# Patient Record
Sex: Female | Born: 1940 | Race: White | Hispanic: No | Smoking: Never smoker
Health system: Southern US, Community
[De-identification: ages and names within clinical notes are randomized; demographics above are authoritative.]

## PROBLEM LIST (undated history)

## (undated) DIAGNOSIS — I10 Essential (primary) hypertension: Secondary | ICD-10-CM

## (undated) DIAGNOSIS — C801 Malignant (primary) neoplasm, unspecified: Secondary | ICD-10-CM

## (undated) HISTORY — PX: ENDOMETRIAL ABLATION: SHX621

## (undated) HISTORY — PX: APPENDECTOMY: SHX54

## (undated) HISTORY — DX: Essential (primary) hypertension: I10

## (undated) HISTORY — PX: ANAL FISSURE REPAIR: SHX2312

---

## 2004-10-28 HISTORY — PX: COLONOSCOPY: SHX174

## 2007-09-01 ENCOUNTER — Ambulatory Visit: Payer: Self-pay | Admitting: Specialist

## 2012-02-04 ENCOUNTER — Ambulatory Visit: Payer: Self-pay | Admitting: Family Medicine

## 2012-02-12 ENCOUNTER — Ambulatory Visit: Payer: Self-pay | Admitting: Family Medicine

## 2013-07-27 ENCOUNTER — Ambulatory Visit: Payer: Self-pay | Admitting: Family Medicine

## 2014-04-17 ENCOUNTER — Ambulatory Visit: Payer: Self-pay | Admitting: Physician Assistant

## 2014-04-22 ENCOUNTER — Ambulatory Visit: Payer: Self-pay | Admitting: Family Medicine

## 2014-11-21 ENCOUNTER — Emergency Department: Payer: Self-pay | Admitting: Emergency Medicine

## 2014-11-21 LAB — BASIC METABOLIC PANEL
Anion Gap: 8 (ref 7–16)
BUN: 15 mg/dL (ref 7–18)
CO2: 27 mmol/L (ref 21–32)
CREATININE: 0.72 mg/dL (ref 0.60–1.30)
Calcium, Total: 8.5 mg/dL (ref 8.5–10.1)
Chloride: 103 mmol/L (ref 98–107)
EGFR (Non-African Amer.): >60
Glucose: 106 mg/dL — ABNORMAL HIGH (ref 65–99)
Osmolality: 277 (ref 275–301)
Potassium: 3.8 mmol/L (ref 3.5–5.1)
Sodium: 138 mmol/L (ref 136–145)

## 2014-11-21 LAB — CBC
HCT: 41.8 % (ref 35.0–47.0)
HGB: 13.9 g/dL (ref 12.0–16.0)
MCH: 30.5 pg (ref 26.0–34.0)
MCHC: 33.4 g/dL (ref 32.0–36.0)
MCV: 92 fL (ref 80–100)
Platelet: 287 10*3/uL (ref 150–440)
RBC: 4.57 10*6/uL (ref 3.80–5.20)
RDW: 12.2 % (ref 11.5–14.5)
WBC: 7.6 10*3/uL (ref 3.6–11.0)

## 2014-11-21 LAB — TROPONIN I: Troponin-I: <0.02 ng/mL

## 2015-02-03 ENCOUNTER — Ambulatory Visit
Admit: 2015-02-03 | Disposition: A | Discharge status: Home / Self Care | Payer: Self-pay | Attending: Family Medicine | Admitting: Family Medicine

## 2015-06-29 ENCOUNTER — Encounter: Payer: Self-pay | Admitting: Family Medicine

## 2015-06-29 ENCOUNTER — Ambulatory Visit (INDEPENDENT_AMBULATORY_CARE_PROVIDER_SITE_OTHER): Payer: Medicare Other | Admitting: Family Medicine

## 2015-06-29 VITALS — BP 120/70 | HR 72 | Temp 98.3°F | Ht 65.0 in | Wt 140.0 lb

## 2015-06-29 DIAGNOSIS — L03011 Cellulitis of right finger: Secondary | ICD-10-CM | POA: Diagnosis not present

## 2015-06-29 NOTE — Progress Notes (Signed)
Name: Dorothy Harvey   MRN: [id number]    DOB: 07/29/1941   Date:06/29/2015       Progress Note  Subjective  Chief Complaint  Chief Complaint  Patient presents with  . Hand Pain    R) thumb pain after "mulching"    Hand Pain  The incident occurred more than 1 week ago (contusion hammar/6 mos/ crack to nail with crazy glue). The incident occurred at home. The injury mechanism was a direct blow. The pain is present in the right hand (thumb). The quality of the pain is described as aching. The pain is at a severity of 7/10. The pain is moderate. The pain has been intermittent since the incident. Pertinent negatives include no muscle weakness or numbness. The symptoms are aggravated by palpation. She has tried nothing for the symptoms. The treatment provided moderate relief.    No problem-specific assessment & plan notes found for this encounter.   Past Medical History  Diagnosis Date  . Hypertension     Past Surgical History  Procedure Laterality Date  . Appendectomy    . Endometrial ablation    . Anal fissure repair    . Colonoscopy  2006    normal    History reviewed. No pertinent family history.  Social History   Social History  . Marital Status: Married    Spouse Name: N/A  . Number of Children: N/A  . Years of Education: N/A   Occupational History  . Not on file.   Social History Main Topics  . Smoking status: Never Smoker   . Smokeless tobacco: Not on file  . Alcohol Use: No  . Drug Use: No  . Sexual Activity: No   Other Topics Concern  . Not on file   Social History Narrative  . No narrative on file    Allergies  Allergen Reactions  . Diazepam Rash  . Hydroxyzine Other (See Comments)  . Meperidine Other (See Comments)  . Penicillins Hives  . Epinephrine      Review of Systems  Constitutional: Negative for fever and chills.  Musculoskeletal: Negative for myalgias, falls and neck pain.       Pain in nail  Skin: Negative for rash.    Neurological: Negative for focal weakness and numbness.  Endo/Heme/Allergies: Does not bruise/bleed easily.     Objective  Filed Vitals:   06/29/15 1434  BP: 120/70  Pulse: 72  Height: 5\' 5"  (1.651 m)  Weight: 140 lb (63.504 kg)    Physical Exam  Cardiovascular: Normal rate, regular rhythm and normal heart sounds.   Pulmonary/Chest: Effort normal and breath sounds normal. She has no wheezes. She has no rales.  Musculoskeletal:       Right hand: She exhibits tenderness.       Hands: Skin: There is erythema.      Assessment & Plan  Problem List Items Addressed This Visit    None        Dr. Otilio Miu [facility]  06/29/2015

## 2015-08-25 ENCOUNTER — Encounter: Payer: Medicare Other | Admitting: Family Medicine

## 2015-09-07 ENCOUNTER — Other Ambulatory Visit: Payer: Self-pay | Admitting: Family Medicine

## 2015-09-07 DIAGNOSIS — Z780 Asymptomatic menopausal state: Secondary | ICD-10-CM

## 2015-09-07 DIAGNOSIS — Z1231 Encounter for screening mammogram for malignant neoplasm of breast: Secondary | ICD-10-CM

## 2015-09-13 ENCOUNTER — Ambulatory Visit
Admission: RE | Admit: 2015-09-13 | Discharge: 2015-09-13 | Disposition: A | Discharge status: Home / Self Care | Payer: Medicare Other | Source: Ambulatory Visit | Attending: Family Medicine | Admitting: Family Medicine

## 2015-09-13 DIAGNOSIS — Z1231 Encounter for screening mammogram for malignant neoplasm of breast: Secondary | ICD-10-CM | POA: Diagnosis present

## 2015-11-20 ENCOUNTER — Other Ambulatory Visit: Payer: Self-pay

## 2016-05-03 ENCOUNTER — Encounter: Payer: Self-pay | Admitting: *Deleted

## 2016-05-03 ENCOUNTER — Ambulatory Visit
Admission: EM | Admit: 2016-05-03 | Discharge: 2016-05-03 | Disposition: A | Discharge status: Home / Self Care | Payer: Medicare Other | Attending: Family Medicine | Admitting: Family Medicine

## 2016-05-03 DIAGNOSIS — J039 Acute tonsillitis, unspecified: Secondary | ICD-10-CM | POA: Diagnosis not present

## 2016-05-03 MED ORDER — DOXYCYCLINE HYCLATE 100 MG PO TABS: 100.0000 mg | ORAL_TABLET | Freq: Two times a day (BID) | ORAL | Status: DC

## 2016-05-03 MED ORDER — LIDOCAINE VISCOUS 2 % MT SOLN: OROMUCOSAL | Status: DC

## 2016-05-03 NOTE — Discharge Instructions (Signed)
Tonsillitis Tonsillitis is an infection of the throat. This infection causes the tonsils to become red, tender, and puffy (swollen). Tonsils are groups of tissue at the back of your throat. If bacteria caused your infection, antibiotic medicine will be given to you. Sometimes symptoms of tonsillitis can be relieved with the use of steroid medicine. If your tonsillitis is severe and happens often, you may need to get your tonsils removed (tonsillectomy). HOME CARE   Rest and sleep often.  Drink enough fluids to keep your pee (urine) clear or pale yellow.  While your throat is sore, eat soft or liquid foods like:  Soup.  Ice cream.  Instant breakfast drinks.  Eat frozen ice pops.  Gargle with a warm or cold liquid to help soothe the throat. Gargle with a water and salt mix. Mix 1/4 teaspoon of salt and 1/4 teaspoon of baking soda in 1 cup of water.  Only take medicines as told by your doctor.  If you are given medicines (antibiotics), take them as told. Finish them even if you start to feel better. GET HELP IF:  You have large, tender lumps in your neck.  You have a rash.  You cough up green, yellow-brown, or bloody fluid.  You cannot swallow liquids or food for 24 hours.  You notice that only one of your tonsils is swollen. GET HELP RIGHT AWAY IF:   You throw up (vomit).  You have a very bad headache.  You have a stiff neck.  You have chest pain.  You have trouble breathing or swallowing.  You have bad throat pain, drooling, or your voice changes.  You have bad pain not helped by medicine.  You cannot fully open your mouth.  You have redness, puffiness, or bad pain in the neck.  You have a fever. MAKE SURE YOU:   Understand these instructions.  Will watch your condition.  Will get help right away if you are not doing well or get worse.   This information is not intended to replace advice given to you by your health care provider. Make sure you discuss any  questions you have with your health care provider.   Document Released: 04/01/2008 Document Revised: 10/19/2013 Document Reviewed: 04/02/2013 Elsevier Interactive Patient Education [employer].

## 2016-05-03 NOTE — ED Notes (Signed)
Patient started having sore throat symptoms 3 days ago. Sore throat symptoms have progressively become worse.

## 2016-05-03 NOTE — ED Provider Notes (Signed)
CSN: [id number]     Arrival date & time 05/03/16  1836 History   First MD Initiated Contact with Patient 05/03/16 1900     Chief Complaint  Patient presents with  . Sore Throat   (Consider location/radiation/quality/duration/timing/severity/associated sxs/prior Treatment) Patient is a 75 y.o. female presenting with pharyngitis. The history is provided by the patient.  Sore Throat This is a new problem. The current episode started more than 2 days ago. The problem occurs constantly. The problem has been gradually worsening. Pertinent negatives include no chest pain, no abdominal pain and no headaches. Nothing aggravates the symptoms. Nothing relieves the symptoms. The treatment provided no relief.    Past Medical History  Diagnosis Date  . Hypertension    Past Surgical History  Procedure Laterality Date  . Appendectomy    . Endometrial ablation    . Anal fissure repair    . Colonoscopy  2006    normal   Family History  Problem Relation Age of Onset  . Hypertension Mother    Social History  Substance Use Topics  . Smoking status: Never Smoker   . Smokeless tobacco: None  . Alcohol Use: No   OB History    No data available     Review of Systems  Cardiovascular: Negative for chest pain.  Gastrointestinal: Negative for abdominal pain.  Neurological: Negative for headaches.    Allergies  Diazepam; Hydroxyzine; Meperidine; Penicillins; Sulfa antibiotics; and Epinephrine  Home Medications   Prior to Admission medications   Medication Sig Start Date End Date Taking? Authorizing Provider  hydrochlorothiazide (HYDRODIURIL) 25 MG tablet Take 1 tablet by mouth 2 (two) times a week. 05/30/15  Yes Historical Provider, MD  losartan (COZAAR) 50 MG tablet Take 1 tablet by mouth daily at 6 (six) AM. 05/30/15  Yes Historical Provider, MD  Omega-3 Fatty Acids (FISH OIL) 1000 MG CAPS Take 1 capsule by mouth daily at 6 (six) AM.   Yes Historical Provider, MD  vitamin A ([facility] VITAMIN A)  10000 UNIT capsule Take 1 capsule by mouth daily at 6 (six) AM.   Yes Historical Provider, MD  vitamin E 200 UNIT capsule Take 200 Units by mouth daily.   Yes Historical Provider, MD  doxycycline (VIBRA-TABS) 100 MG tablet Take 1 tablet (100 mg total) by mouth 2 (two) times daily. 05/03/16   Norval Gable, MD  lidocaine (XYLOCAINE) 2 % solution 20 ml gargle and spit q 6 hours prn 05/03/16   Norval Gable, MD   Meds Ordered and Administered this Visit  Medications - No data to display  BP 188/87 mmHg  Pulse 74  Temp(Src) 98.3 F (36.8 C) (Oral)  Resp 18  Ht 5\' 5"  (1.651 m)  Wt 134 lb (60.782 kg)  BMI 22.30 kg/m2  SpO2 100% No data found.   Physical Exam  Constitutional: She appears well-developed and well-nourished. No distress.  HENT:  Head: Normocephalic and atraumatic.  Right Ear: Tympanic membrane, external ear and ear canal normal.  Left Ear: Tympanic membrane, external ear and ear canal normal.  Nose: No nose lacerations, sinus tenderness, nasal deformity, septal deviation or nasal septal hematoma. No epistaxis.  No foreign bodies.  Mouth/Throat: Uvula is midline and mucous membranes are normal. Posterior oropharyngeal erythema present. No oropharyngeal exudate, posterior oropharyngeal edema or tonsillar abscesses.  Eyes: Conjunctivae and EOM are normal. Pupils are equal, round, and reactive to light. Right eye exhibits no discharge. Left eye exhibits no discharge. No scleral icterus.  Neck: Normal range of motion.  Neck supple. No thyromegaly present.  Cardiovascular: Normal rate, regular rhythm and normal heart sounds.   Pulmonary/Chest: Effort normal and breath sounds normal. No respiratory distress. She has no wheezes. She has no rales.  Lymphadenopathy:    She has no cervical adenopathy.  Skin: She is not diaphoretic.  Nursing note and vitals reviewed.   ED Course  Procedures (including critical care time)  Labs Review Labs Reviewed - No data to display  Imaging  Review No results found.   Visual Acuity Review  Right Eye Distance:   Left Eye Distance:   Bilateral Distance:    Right Eye Near:   Left Eye Near:    Bilateral Near:         MDM   1. Tonsillitis    Discharge Medication List as of 05/03/2016  7:23 PM    START taking these medications   Details  doxycycline (VIBRA-TABS) 100 MG tablet Take 1 tablet (100 mg total) by mouth 2 (two) times daily., Starting 05/03/2016, Until Discontinued, Normal    lidocaine (XYLOCAINE) 2 % solution 20 ml gargle and spit q 6 hours prn, Normal       1. diagnosis reviewed with patient 2. rx as per orders above; reviewed possible side effects, interactions, risks and benefits  3. Recommend supportive treatment with otc analgesics prn 4. Follow-up prn if symptoms worsen or don't improve    Norval Gable, MD 05/03/16 [phone]

## 2016-05-07 ENCOUNTER — Ambulatory Visit
Admission: RE | Admit: 2016-05-07 | Discharge: 2016-05-07 | Disposition: A | Discharge status: Home / Self Care | Payer: Medicare Other | Source: Ambulatory Visit | Attending: Otolaryngology | Admitting: Otolaryngology

## 2016-05-07 ENCOUNTER — Other Ambulatory Visit: Payer: Self-pay | Admitting: Otolaryngology

## 2016-05-07 DIAGNOSIS — R070 Pain in throat: Secondary | ICD-10-CM | POA: Diagnosis present

## 2016-05-07 DIAGNOSIS — E041 Nontoxic single thyroid nodule: Secondary | ICD-10-CM | POA: Insufficient documentation

## 2016-05-07 LAB — POCT I-STAT CREATININE: Creatinine, Ser: 0.6 mg/dL (ref 0.44–1.00)

## 2016-05-07 MED ORDER — IOPAMIDOL (ISOVUE-300) INJECTION 61%
75.0000 mL | Freq: Once | INTRAVENOUS | Status: AC | PRN
  Administered 2016-05-07: 75 mL via INTRAVENOUS

## 2016-09-04 ENCOUNTER — Other Ambulatory Visit: Payer: Self-pay | Admitting: Family Medicine

## 2016-09-04 DIAGNOSIS — Z1231 Encounter for screening mammogram for malignant neoplasm of breast: Secondary | ICD-10-CM

## 2016-09-25 ENCOUNTER — Ambulatory Visit: Admission: RE | Admit: 2016-09-25 | Payer: Medicare Other | Source: Ambulatory Visit

## 2016-11-15 ENCOUNTER — Other Ambulatory Visit: Payer: Self-pay | Admitting: Medical Oncology

## 2016-11-15 ENCOUNTER — Ambulatory Visit
Admission: RE | Admit: 2016-11-15 | Discharge: 2016-11-15 | Disposition: A | Discharge status: Home / Self Care | Payer: Medicare Other | Source: Ambulatory Visit | Attending: Medical Oncology | Admitting: Medical Oncology

## 2016-11-15 DIAGNOSIS — R05 Cough: Secondary | ICD-10-CM | POA: Diagnosis present

## 2016-11-15 DIAGNOSIS — R058 Other specified cough: Secondary | ICD-10-CM

## 2017-01-07 ENCOUNTER — Ambulatory Visit: Payer: Medicare Other

## 2017-01-08 ENCOUNTER — Other Ambulatory Visit: Payer: Self-pay | Admitting: Family Medicine

## 2017-01-08 ENCOUNTER — Ambulatory Visit
Admission: RE | Admit: 2017-01-08 | Discharge: 2017-01-08 | Disposition: A | Discharge status: Home / Self Care | Payer: Medicare Other | Source: Ambulatory Visit | Attending: Family Medicine | Admitting: Family Medicine

## 2017-01-08 DIAGNOSIS — Z1231 Encounter for screening mammogram for malignant neoplasm of breast: Secondary | ICD-10-CM

## 2017-01-08 HISTORY — DX: Malignant (primary) neoplasm, unspecified: C80.1

## 2017-07-24 ENCOUNTER — Other Ambulatory Visit: Payer: Self-pay | Admitting: Family Medicine

## 2017-07-24 DIAGNOSIS — M5410 Radiculopathy, site unspecified: Secondary | ICD-10-CM

## 2017-07-28 ENCOUNTER — Other Ambulatory Visit
Admission: RE | Admit: 2017-07-28 | Discharge: 2017-07-28 | Disposition: A | Discharge status: Home / Self Care | Payer: Medicare Other | Source: Ambulatory Visit | Attending: Family Medicine | Admitting: Family Medicine

## 2017-07-28 ENCOUNTER — Other Ambulatory Visit: Payer: Self-pay | Admitting: Family Medicine

## 2017-07-28 ENCOUNTER — Ambulatory Visit
Admission: RE | Admit: 2017-07-28 | Discharge: 2017-07-28 | Disposition: A | Discharge status: Home / Self Care | Payer: Medicare Other | Source: Ambulatory Visit | Attending: Family Medicine | Admitting: Family Medicine

## 2017-07-28 DIAGNOSIS — M5136 Other intervertebral disc degeneration, lumbar region: Secondary | ICD-10-CM | POA: Diagnosis not present

## 2017-07-28 DIAGNOSIS — M48061 Spinal stenosis, lumbar region without neurogenic claudication: Secondary | ICD-10-CM | POA: Insufficient documentation

## 2017-07-28 DIAGNOSIS — M5410 Radiculopathy, site unspecified: Secondary | ICD-10-CM

## 2017-07-28 DIAGNOSIS — M4316 Spondylolisthesis, lumbar region: Secondary | ICD-10-CM | POA: Insufficient documentation

## 2017-07-28 LAB — CREATININE, SERUM: Creatinine, Ser: 0.63 mg/dL (ref 0.44–1.00)

## 2017-08-18 ENCOUNTER — Other Ambulatory Visit: Payer: Self-pay | Admitting: Family

## 2017-08-18 DIAGNOSIS — M5416 Radiculopathy, lumbar region: Secondary | ICD-10-CM

## 2017-08-18 DIAGNOSIS — R200 Anesthesia of skin: Secondary | ICD-10-CM

## 2017-08-26 ENCOUNTER — Ambulatory Visit
Admission: RE | Admit: 2017-08-26 | Discharge: 2017-08-26 | Disposition: A | Discharge status: Home / Self Care | Payer: Medicare Other | Source: Ambulatory Visit | Attending: Family | Admitting: Family

## 2017-08-26 DIAGNOSIS — M4726 Other spondylosis with radiculopathy, lumbar region: Secondary | ICD-10-CM | POA: Insufficient documentation

## 2017-08-26 DIAGNOSIS — M4316 Spondylolisthesis, lumbar region: Secondary | ICD-10-CM | POA: Diagnosis not present

## 2017-08-26 DIAGNOSIS — M5416 Radiculopathy, lumbar region: Secondary | ICD-10-CM | POA: Diagnosis present

## 2017-08-26 DIAGNOSIS — R200 Anesthesia of skin: Secondary | ICD-10-CM

## 2017-08-26 DIAGNOSIS — M48061 Spinal stenosis, lumbar region without neurogenic claudication: Secondary | ICD-10-CM | POA: Insufficient documentation

## 2017-12-17 ENCOUNTER — Other Ambulatory Visit: Payer: Self-pay | Admitting: Family Medicine

## 2017-12-17 DIAGNOSIS — Z1231 Encounter for screening mammogram for malignant neoplasm of breast: Secondary | ICD-10-CM

## 2018-01-12 ENCOUNTER — Ambulatory Visit
Admission: RE | Admit: 2018-01-12 | Discharge: 2018-01-12 | Disposition: A | Discharge status: Home / Self Care | Payer: Medicare Other | Source: Ambulatory Visit | Attending: Family Medicine | Admitting: Family Medicine

## 2018-01-12 DIAGNOSIS — Z1231 Encounter for screening mammogram for malignant neoplasm of breast: Secondary | ICD-10-CM | POA: Insufficient documentation

## 2019-06-01 ENCOUNTER — Other Ambulatory Visit: Payer: Self-pay | Admitting: Family Medicine

## 2019-06-01 DIAGNOSIS — Z1231 Encounter for screening mammogram for malignant neoplasm of breast: Secondary | ICD-10-CM

## 2019-07-13 ENCOUNTER — Encounter (INDEPENDENT_AMBULATORY_CARE_PROVIDER_SITE_OTHER): Payer: Self-pay

## 2019-07-13 ENCOUNTER — Other Ambulatory Visit: Payer: Self-pay

## 2019-07-13 ENCOUNTER — Ambulatory Visit
Admission: RE | Admit: 2019-07-13 | Discharge: 2019-07-13 | Disposition: A | Discharge status: Home / Self Care | Payer: Medicare Other | Source: Ambulatory Visit | Attending: Family Medicine | Admitting: Family Medicine

## 2019-07-13 DIAGNOSIS — Z1231 Encounter for screening mammogram for malignant neoplasm of breast: Secondary | ICD-10-CM | POA: Diagnosis present

## 2020-02-21 ENCOUNTER — Ambulatory Visit: Payer: Medicare Other | Attending: Internal Medicine

## 2020-08-23 ENCOUNTER — Other Ambulatory Visit: Payer: Self-pay | Admitting: Family Medicine

## 2020-08-23 DIAGNOSIS — Z1231 Encounter for screening mammogram for malignant neoplasm of breast: Secondary | ICD-10-CM

## 2020-09-20 ENCOUNTER — Ambulatory Visit: Payer: Medicare Other

## 2020-11-02 ENCOUNTER — Inpatient Hospital Stay: Admission: RE | Admit: 2020-11-02 | Payer: Medicare Other | Source: Ambulatory Visit

## 2020-12-04 ENCOUNTER — Other Ambulatory Visit: Payer: Self-pay

## 2020-12-04 ENCOUNTER — Ambulatory Visit
Admission: RE | Admit: 2020-12-04 | Discharge: 2020-12-04 | Disposition: A | Discharge status: Home / Self Care | Payer: Medicare Other | Source: Ambulatory Visit | Attending: Family Medicine | Admitting: Family Medicine

## 2020-12-04 DIAGNOSIS — Z1231 Encounter for screening mammogram for malignant neoplasm of breast: Secondary | ICD-10-CM | POA: Insufficient documentation

## 2021-01-30 ENCOUNTER — Ambulatory Visit
Admission: EM | Admit: 2021-01-30 | Discharge: 2021-01-30 | Disposition: A | Discharge status: Home / Self Care | Payer: Medicare Other | Attending: Emergency Medicine | Admitting: Emergency Medicine

## 2021-01-30 ENCOUNTER — Encounter: Payer: Self-pay | Admitting: Emergency Medicine

## 2021-01-30 ENCOUNTER — Other Ambulatory Visit: Payer: Self-pay

## 2021-01-30 DIAGNOSIS — A938 Other specified arthropod-borne viral fevers: Secondary | ICD-10-CM | POA: Diagnosis not present

## 2021-01-30 DIAGNOSIS — S0006XA Insect bite (nonvenomous) of scalp, initial encounter: Secondary | ICD-10-CM

## 2021-01-30 DIAGNOSIS — W57XXXA Bitten or stung by nonvenomous insect and other nonvenomous arthropods, initial encounter: Secondary | ICD-10-CM

## 2021-01-30 MED ORDER — DOXYCYCLINE HYCLATE 100 MG PO CAPS
100.0000 mg | ORAL_CAPSULE | Freq: Two times a day (BID) | ORAL | 0 refills | Status: AC
Start: 2021-01-30 — End: 2021-02-13

## 2021-01-30 NOTE — ED Triage Notes (Signed)
Pt c/o swollen cervical lymph node. Started about 3 days ago. She states she found a tick in this area this morning. She states the area where the tick was removed is swollen also. She states she has had a mild headache and slightly nauseous.

## 2021-01-30 NOTE — Discharge Instructions (Addendum)
The doxycycline twice daily for 14 days.  Take it with food.  The doxycycline will make you more sensitive to the sun so be sure that you are wearing sunscreen and you are reapply the sunscreen every 2 hours while outdoors or indirect sunlight.  If you develop any rashes, increasing joint pain, protracted fever, headache, or fatigue call the clinic and we will extend your doxycycline for 28 days.

## 2021-01-30 NOTE — ED Provider Notes (Signed)
[facility]    CSN: [id number] Arrival date & time: 01/30/21  1247      History   Chief Complaint Chief Complaint  Patient presents with  . Insect Bite    tick    HPI Dorothy Harvey is a 80 y.o. female.   HPI   80 year old female here for evaluation of swollen lymph nodes and a tick bite.  Patient reports that she noticed a swollen lymph node on the right side of her neck 2 days ago and then more developed down the back of the right side of her neck yesterday.  Patient states she found a tick in her hairline and removed it.  The tick was still alive she reports.  Patient states that the area is red according to her granddaughter.  Patient has had associate symptoms of a mild headache, nausea, chills, and a fever up to 100.  Patient has joint pain at baseline so she is unaware of any changes or increase in the joint pain.  Patient denies any rashes.    Past Medical History:  Diagnosis Date  . Cancer (HCC)    skin ca- squamous  . Hypertension     There are no problems to display for this patient.   Past Surgical History:  Procedure Laterality Date  . ANAL FISSURE REPAIR    . APPENDECTOMY    . COLONOSCOPY  2006   normal  . ENDOMETRIAL ABLATION      OB History   No obstetric history on file.      Home Medications    Prior to Admission medications   Medication Sig Start Date End Date Taking? Authorizing Provider  doxycycline (VIBRAMYCIN) 100 MG capsule Take 1 capsule (100 mg total) by mouth 2 (two) times daily for 14 days. 01/30/21 02/13/21 Yes Margarette Canada, NP  vitamin E 200 UNIT capsule Take 200 Units by mouth daily.    [provider]  hydrochlorothiazide (HYDRODIURIL) 25 MG tablet Take 1 tablet by mouth 2 (two) times a week. 05/30/15 01/30/21  [provider]  losartan (COZAAR) 50 MG tablet Take 1 tablet by mouth daily at 6 (six) AM. 05/30/15 01/30/21  [provider]    Family History Family History  Problem Relation  Age of Onset  . Hypertension Mother   . Breast cancer Neg Hx     Social History Social History   Tobacco Use  . Smoking status: Never Smoker  . Smokeless tobacco: Never Used  Vaping Use  . Vaping Use: Never used  Substance Use Topics  . Alcohol use: No    Alcohol/week: 0.0 standard drinks  . Drug use: No     Allergies   Diazepam, Hydroxyzine, Meperidine, Penicillins, Sulfa antibiotics, and Epinephrine   Review of Systems Review of Systems  Constitutional: Positive for chills and fever. Negative for activity change and appetite change.  Gastrointestinal: Positive for nausea.  Skin: Positive for wound. Negative for rash.  Neurological: Positive for headaches.     Physical Exam Triage Vital Signs ED Triage Vitals  Enc Vitals Group     BP 01/30/21 1314 (!) 179/84     Pulse Rate 01/30/21 1314 82     Resp 01/30/21 1314 18     Temp 01/30/21 1314 98.1 F (36.7 C)     Temp Source 01/30/21 1314 Oral     SpO2 01/30/21 1314 97 %     Weight 01/30/21 1310 134 lb 0.6 oz (60.8 kg)     Height 01/30/21  1310 5\' 5"  (1.651 m)     Head Circumference --      Peak Flow --      Pain Score 01/30/21 1310 5     Pain Loc --      Pain Edu? --      Excl. in [place]? --    No data found.  Updated Vital Signs BP (!) 179/84 (BP Location: Right Arm)   Pulse 82   Temp 98.1 F (36.7 C) (Oral)   Resp 18   Ht 5\' 5"  (1.651 m)   Wt 134 lb 0.6 oz (60.8 kg)   SpO2 97%   BMI 22.31 kg/m   Visual Acuity Right Eye Distance:   Left Eye Distance:   Bilateral Distance:    Right Eye Near:   Left Eye Near:    Bilateral Near:     Physical Exam Vitals and nursing note reviewed.  Constitutional:      General: She is not in acute distress.    Appearance: Normal appearance. She is normal weight. She is not ill-appearing.  Cardiovascular:     Rate and Rhythm: Normal rate and regular rhythm.     Pulses: Normal pulses.     Heart sounds: Normal heart sounds. No murmur heard. No gallop.    Pulmonary:     Effort: Pulmonary effort is normal.     Breath sounds: No wheezing, rhonchi or rales.  Skin:    General: Skin is warm and dry.     Capillary Refill: Capillary refill takes less than 2 seconds.     Findings: Erythema present. No rash.  Neurological:     General: No focal deficit present.     Mental Status: She is alert and oriented to person, place, and time.  Psychiatric:        Mood and Affect: Mood normal.        Behavior: Behavior normal.        Thought Content: Thought content normal.        Judgment: Judgment normal.      UC Treatments / Results  Labs (all labs ordered are listed, but only abnormal results are displayed) Labs Reviewed - No data to display  EKG   Radiology No results found.  Procedures Procedures (including critical care time)  Medications Ordered in UC Medications - No data to display  Initial Impression / Assessment and Plan / UC Course  I have reviewed the triage vital signs and the nursing notes.  Pertinent labs & imaging results that were available during my care of the patient were reviewed by me and considered in my medical decision making (see chart for details).   For evaluation of swollen lymph nodes in conjunction with a tick bite.  Patient discovered a tick today but she has had the swollen lymph nodes for 2 days.  Patient has a large, mildly tender, freely mobile lymph node on the right side of her neck and then she has shotty lymph nodes extending from the occiput down the right side of her neck posteriorly.  Patient has a small area of erythema at the site of the tick bite.  There is no apparent gel part remaining in the wound.  Patient symptoms are concerning consider she has had a headache, nausea, chills, and fever.  Patient states that the tick was not completely flat but it was not fully engorged when she removed it.  It is possible the tick was in place for up to 72 hours before she  found and removed it.  Using this  cluster of symptoms and the presumptive time of the tick being embedded will prophylactically cover patient with doxycycline 100 mg twice daily for 14 days.   Final Clinical Impressions(s) / UC Diagnoses   Final diagnoses:  Tick borne fever  Tick bite of scalp, initial encounter     Discharge Instructions     The doxycycline twice daily for 14 days.  Take it with food.  The doxycycline will make you more sensitive to the sun so be sure that you are wearing sunscreen and you are reapply the sunscreen every 2 hours while outdoors or indirect sunlight.  If you develop any rashes, increasing joint pain, protracted fever, headache, or fatigue call the clinic and we will extend your doxycycline for 28 days.    ED Prescriptions    Medication Sig Dispense Auth. Provider   doxycycline (VIBRAMYCIN) 100 MG capsule Take 1 capsule (100 mg total) by mouth 2 (two) times daily for 14 days. 28 capsule Margarette Canada, NP     PDMP not reviewed this encounter.   Margarette Canada, NP 01/30/21 1353

## 2021-08-09 ENCOUNTER — Other Ambulatory Visit: Payer: Self-pay | Admitting: Family Medicine

## 2021-08-09 ENCOUNTER — Ambulatory Visit
Admission: RE | Admit: 2021-08-09 | Discharge: 2021-08-09 | Disposition: A | Discharge status: Home / Self Care | Payer: Medicare Other | Source: Ambulatory Visit | Attending: Family Medicine | Admitting: Family Medicine

## 2021-08-09 ENCOUNTER — Ambulatory Visit
Admission: RE | Admit: 2021-08-09 | Discharge: 2021-08-09 | Disposition: A | Discharge status: Home / Self Care | Payer: Medicare Other | Attending: Family Medicine | Admitting: Family Medicine

## 2021-08-09 ENCOUNTER — Other Ambulatory Visit: Payer: Self-pay

## 2021-08-09 DIAGNOSIS — M25351 Other instability, right hip: Secondary | ICD-10-CM | POA: Diagnosis present

## 2021-08-09 DIAGNOSIS — M25551 Pain in right hip: Secondary | ICD-10-CM | POA: Diagnosis present

## 2021-09-26 ENCOUNTER — Other Ambulatory Visit: Payer: Self-pay | Admitting: Physician Assistant

## 2021-09-26 DIAGNOSIS — G8929 Other chronic pain: Secondary | ICD-10-CM

## 2021-09-26 DIAGNOSIS — M5442 Lumbago with sciatica, left side: Secondary | ICD-10-CM

## 2021-09-27 ENCOUNTER — Other Ambulatory Visit: Payer: Self-pay | Admitting: Physician Assistant

## 2021-09-27 DIAGNOSIS — G8929 Other chronic pain: Secondary | ICD-10-CM

## 2021-09-27 DIAGNOSIS — M5441 Lumbago with sciatica, right side: Secondary | ICD-10-CM

## 2021-10-05 ENCOUNTER — Ambulatory Visit
Admission: RE | Admit: 2021-10-05 | Discharge: 2021-10-05 | Disposition: A | Discharge status: Home / Self Care | Payer: Medicare Other | Source: Ambulatory Visit | Attending: Physician Assistant | Admitting: Physician Assistant

## 2021-10-05 ENCOUNTER — Other Ambulatory Visit: Payer: Self-pay

## 2021-10-05 DIAGNOSIS — M5442 Lumbago with sciatica, left side: Secondary | ICD-10-CM | POA: Diagnosis present

## 2021-10-05 DIAGNOSIS — M5441 Lumbago with sciatica, right side: Secondary | ICD-10-CM | POA: Insufficient documentation

## 2021-10-05 DIAGNOSIS — G8929 Other chronic pain: Secondary | ICD-10-CM | POA: Diagnosis present

## 2021-11-05 ENCOUNTER — Other Ambulatory Visit: Payer: Self-pay | Admitting: Family Medicine

## 2021-11-06 ENCOUNTER — Other Ambulatory Visit: Payer: Self-pay | Admitting: Family Medicine

## 2021-11-06 DIAGNOSIS — Z1231 Encounter for screening mammogram for malignant neoplasm of breast: Secondary | ICD-10-CM

## 2022-01-28 ENCOUNTER — Ambulatory Visit
Admission: RE | Admit: 2022-01-28 | Discharge: 2022-01-28 | Disposition: A | Discharge status: Home / Self Care | Payer: Medicare Other | Source: Ambulatory Visit | Attending: Family Medicine | Admitting: Family Medicine

## 2022-01-28 DIAGNOSIS — Z1231 Encounter for screening mammogram for malignant neoplasm of breast: Secondary | ICD-10-CM | POA: Diagnosis not present

## 2022-02-05 IMAGING — MR MR LUMBAR SPINE W/O CM
4 of 5 series · 25 of 48 positions shown · non-contrast
Comparison: MR lumbar 08/26/2017; CT lumbar 07/28/2017; X-ray
lumbar 07/27/2013.

CLINICAL DATA: Chronic lower back pain with bilateral leg weakness
for 4 years, and notes trouble walking. Patient reports nerve
damage.

EXAM:
MRI LUMBAR SPINE WITHOUT CONTRAST
TECHNIQUE: Multiplanar, multisequence MR imaging of the lumbar spine was
performed. No intravenous contrast was administered.

[Series 2: T2 · sagittal · 4.0mm · 0.81mm/px · 6 of 17 slices shown (1 of 2)]
[im 1/17]
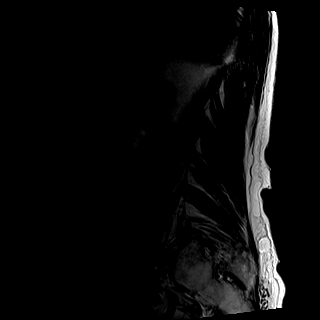
[im 4/17]
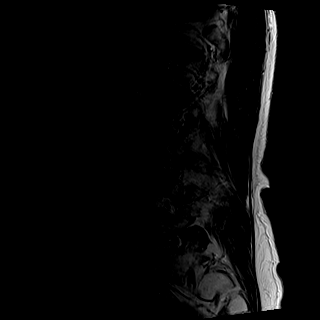
[im 7/17]
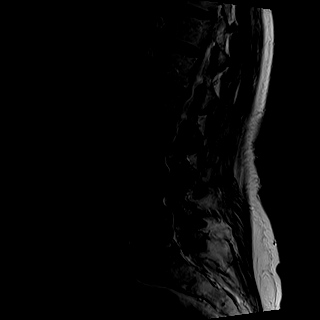
[im 10/17]
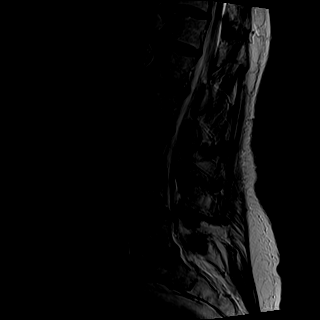
[im 13/17]
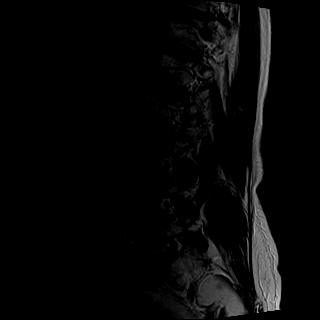
[im 17/17]
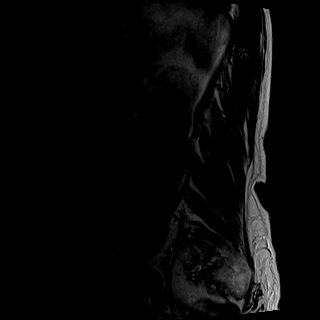

[Series 3: T1 · sagittal · 4.0mm · 0.41mm/px · 6 of 17 slices shown (1 of 2)]
[im 1/17]
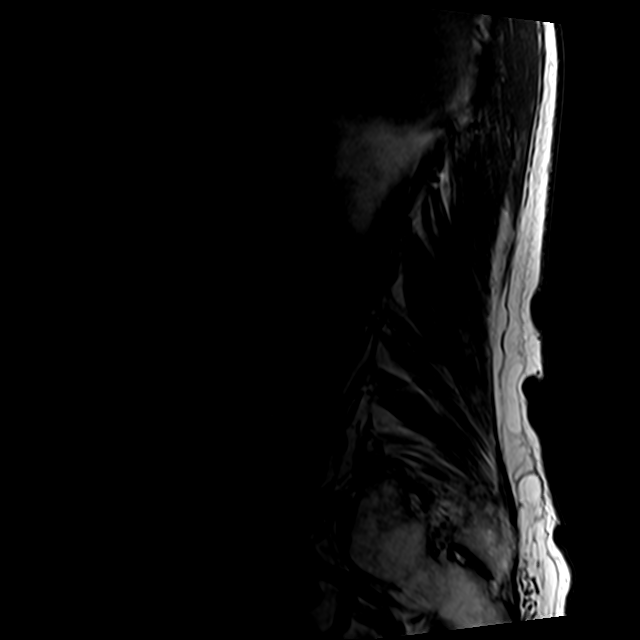
[im 4/17]
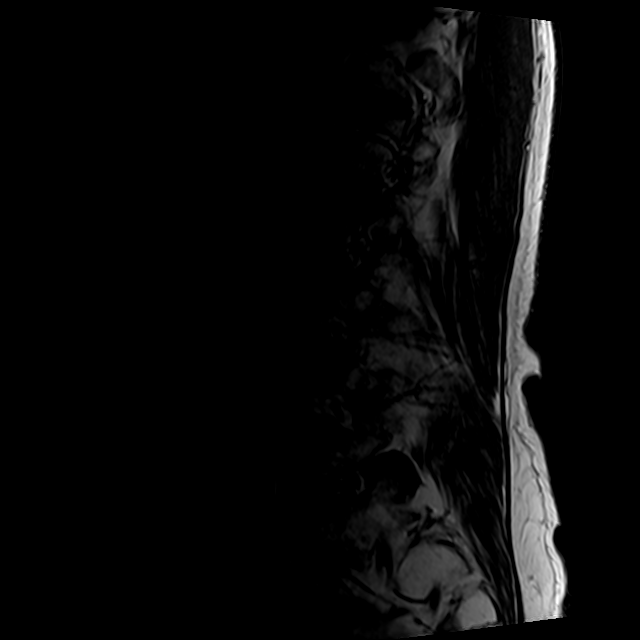
[im 7/17]
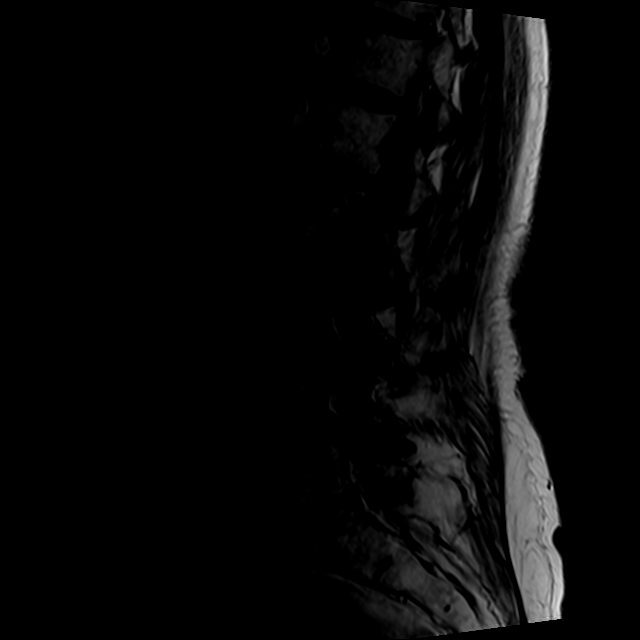
[im 10/17]
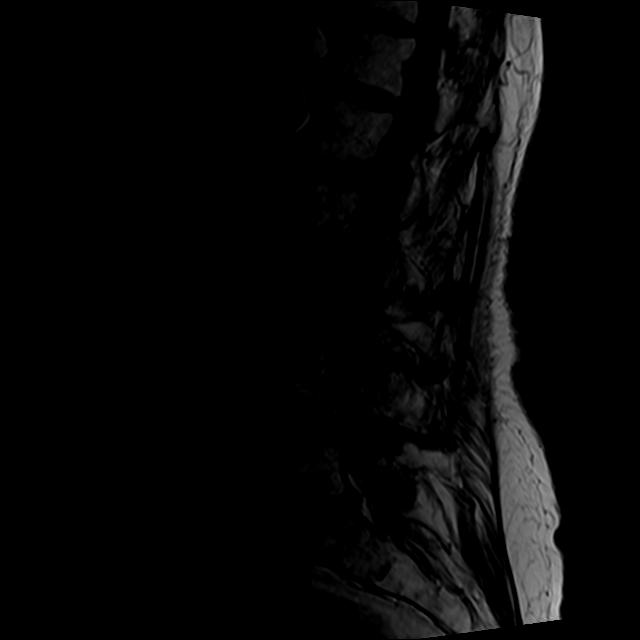
[im 13/17]
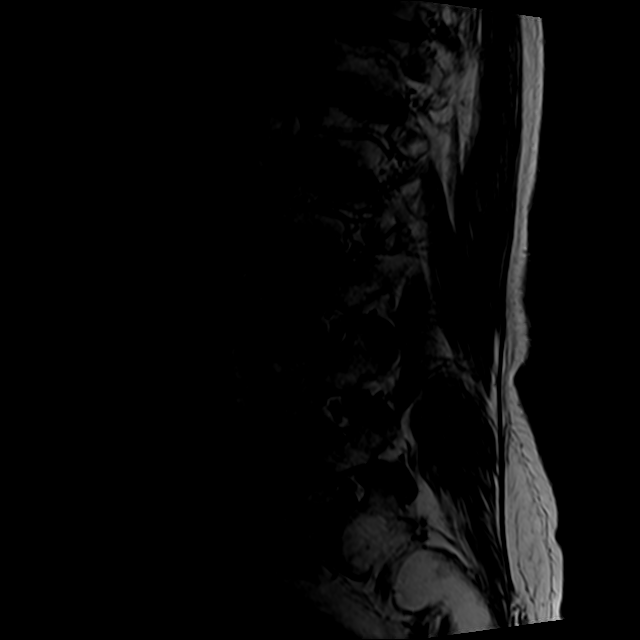
[im 17/17]
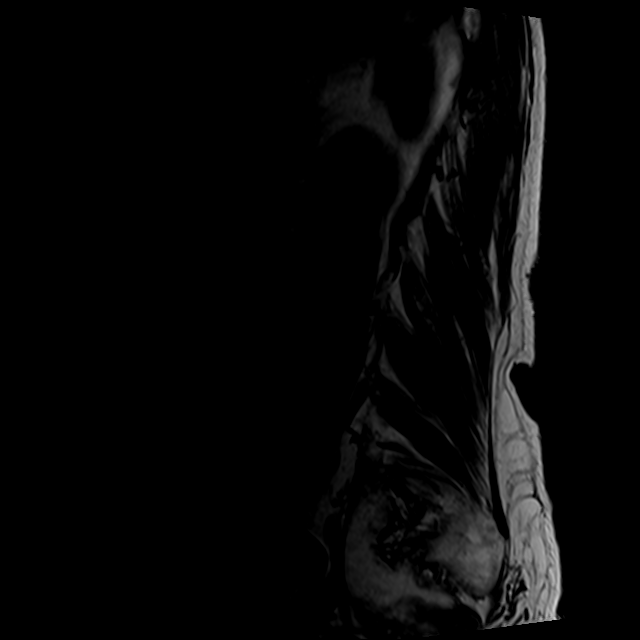

[Series 5: T2 · axial · 4.0mm · 0.78mm/px · z∈[-69,+157]mm · 9 of 41 slices shown (2 of 2)]
[im 1/41]
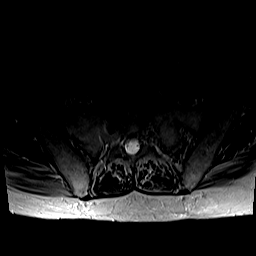
[im 6/41]
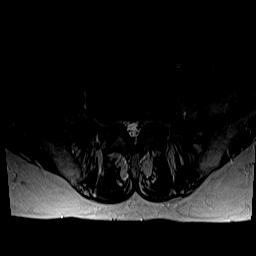
[im 12/41]
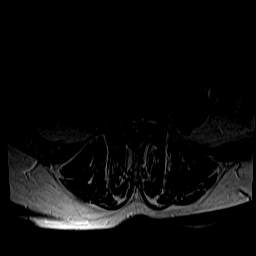
[im 18/41]
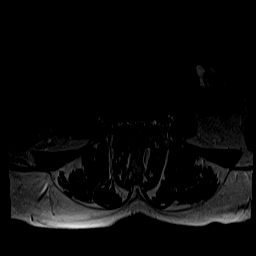
[im 21/41]
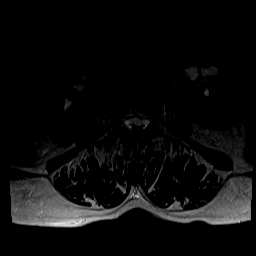
[im 23/41]
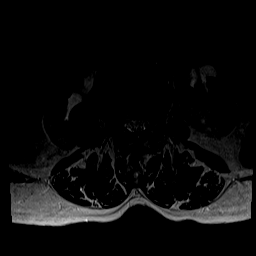
[im 29/41]
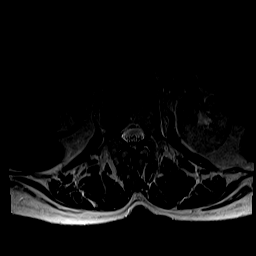
[im 35/41]
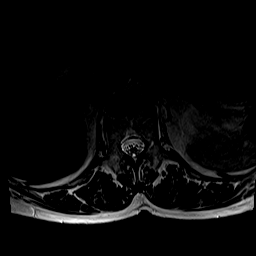
[im 41/41]
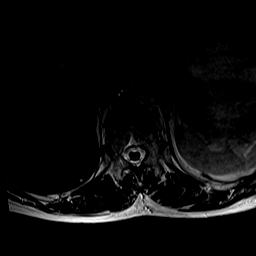

[Series 6: T1 · axial · 4.0mm · 0.39mm/px · z∈[-69,+127]mm · 4 of 41 slices shown (2 of 2)]
[im 1/41]
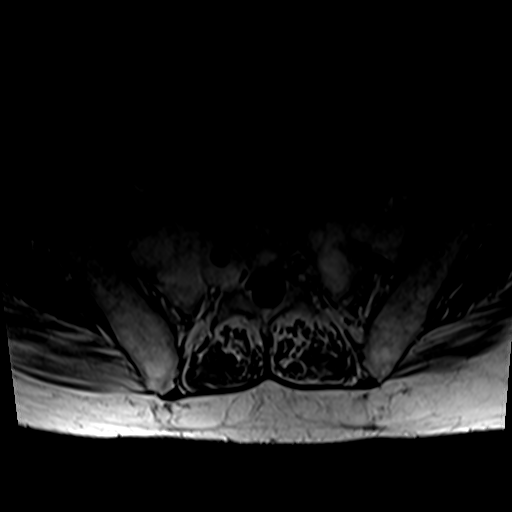
[im 6/41]
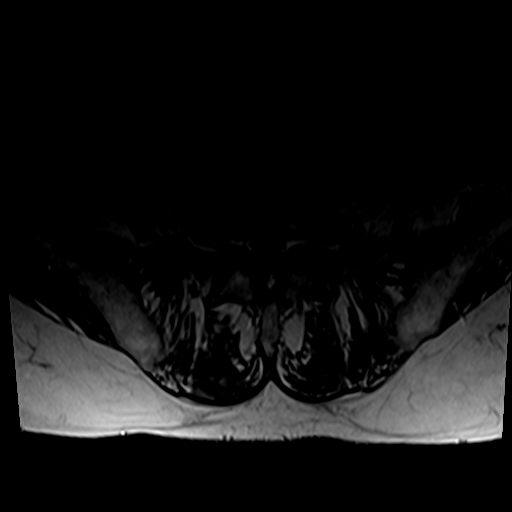
[im 21/41]
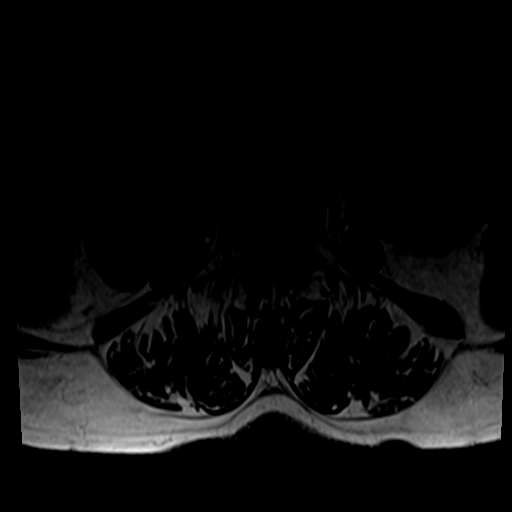
[im 35/41]
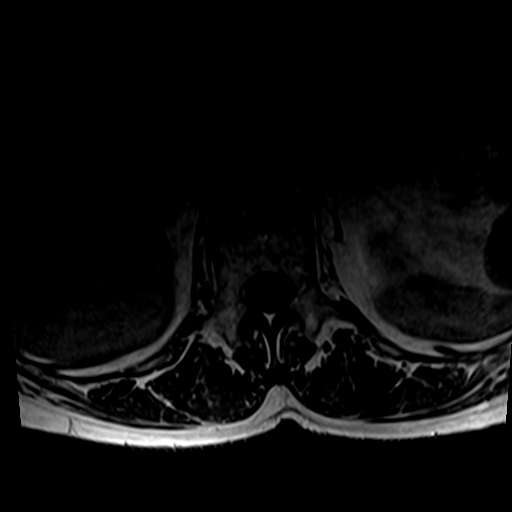

[25 of 48 positions shown; findings below may reference images not displayed]

FINDINGS: Segmentation: Standard.

Alignment: There is 5 mm grade 1 anterolisthesis L4 on L5. There is
2 mm anterolisthesis at L3-4.

Vertebrae: No fracture, evidence of discitis, or bone lesion.

Conus medullaris and cauda equina: Conus extends to the L1-2 level.
Conus and cauda equina appear normal.

Paraspinal and other soft tissues: Horseshoe kidney.

Disc levels:

T12-L1: Minimal disc bulge. Mild bilateral facet hypertrophy. Mild
bilateral foraminal stenosis. No significant canal stenosis.
Unchanged.

L1-L2: Mild diffuse disc bulge. Mild bilateral facet arthropathy.
Mild bilateral foraminal stenosis. No significant canal stenosis.
Unchanged.

L2-L3: Mild diffuse disc bulge. Mild bilateral facet arthropathy.
Borderline-mild canal stenosis. Mild bilateral foraminal stenosis.
Findings may be slightly progressed from prior.

L3-L4: Anterolisthesis with disc uncovering. Moderate bilateral
facet arthropathy with ligamentum flavum buckling. Findings result
in moderate canal stenosis with mild bilateral foraminal stenosis,
right slightly worse than left. Findings slightly progressed from
prior.

L4-L5: Diffuse disc bulge with moderate bilateral facet arthropathy
and ligamentum flavum buckling. Findings result in severe canal
stenosis with mild bilateral foraminal stenosis. Findings slightly
progressed from prior.

L5-S1: Diffuse disc osteophyte complex with small right
extraforaminal protrusion. Mild-moderate bilateral facet
arthropathy. Mild left and moderate right foraminal stenosis. No
canal stenosis. Unchanged.
IMPRESSION: 1. Multilevel lumbar spondylosis, slightly progressed compared to
the previous MRI.
2. Severe canal stenosis at L4-5.
3. Moderate canal stenosis at L3-4. Mild canal stenosis at L2-3.
4. Moderate right foraminal stenosis at L5-S1. Mild bilateral
foraminal stenosis at L2-L3, L3-L4 and L4-5.
5. Horseshoe kidney.

## 2022-06-03 ENCOUNTER — Other Ambulatory Visit: Payer: Self-pay | Admitting: Family Medicine

## 2022-06-03 ENCOUNTER — Other Ambulatory Visit (HOSPITAL_COMMUNITY): Payer: Self-pay | Admitting: Family Medicine

## 2022-06-03 DIAGNOSIS — R10815 Periumbilic abdominal tenderness: Secondary | ICD-10-CM

## 2022-06-03 DIAGNOSIS — R10812 Left upper quadrant abdominal tenderness: Secondary | ICD-10-CM

## 2022-06-10 ENCOUNTER — Ambulatory Visit
Admission: RE | Admit: 2022-06-10 | Discharge: 2022-06-10 | Disposition: A | Discharge status: Home / Self Care | Payer: Medicare Other | Source: Ambulatory Visit | Attending: Family Medicine | Admitting: Family Medicine

## 2022-06-10 DIAGNOSIS — R10815 Periumbilic abdominal tenderness: Secondary | ICD-10-CM

## 2022-06-10 DIAGNOSIS — R10812 Left upper quadrant abdominal tenderness: Secondary | ICD-10-CM | POA: Diagnosis present

## 2022-06-10 MED ORDER — IOHEXOL 300 MG/ML  SOLN
100.0000 mL | Freq: Once | INTRAMUSCULAR | Status: AC | PRN
  Administered 2022-06-10: 100 mL via INTRAVENOUS

## 2022-09-27 ENCOUNTER — Other Ambulatory Visit: Payer: Self-pay | Admitting: Cardiology

## 2022-09-27 DIAGNOSIS — I700 Atherosclerosis of aorta: Secondary | ICD-10-CM

## 2022-10-01 ENCOUNTER — Ambulatory Visit
Admission: RE | Admit: 2022-10-01 | Discharge: 2022-10-01 | Disposition: A | Discharge status: Home / Self Care | Payer: Medicare Other | Source: Ambulatory Visit | Attending: Cardiology | Admitting: Cardiology

## 2022-10-01 DIAGNOSIS — I700 Atherosclerosis of aorta: Secondary | ICD-10-CM | POA: Diagnosis present

## 2022-10-01 MED ORDER — IOHEXOL 350 MG/ML SOLN
100.0000 mL | Freq: Once | INTRAVENOUS | Status: AC | PRN
  Administered 2022-10-01: 100 mL via INTRAVENOUS

## 2022-11-25 ENCOUNTER — Other Ambulatory Visit: Payer: Self-pay | Admitting: Interventional Cardiology

## 2022-11-25 DIAGNOSIS — R0989 Other specified symptoms and signs involving the circulatory and respiratory systems: Secondary | ICD-10-CM

## 2022-12-02 ENCOUNTER — Ambulatory Visit
Admission: RE | Admit: 2022-12-02 | Discharge: 2022-12-02 | Disposition: A | Discharge status: Home / Self Care | Payer: Medicare Other | Source: Ambulatory Visit | Attending: Interventional Cardiology | Admitting: Interventional Cardiology

## 2022-12-02 DIAGNOSIS — R0989 Other specified symptoms and signs involving the circulatory and respiratory systems: Secondary | ICD-10-CM | POA: Diagnosis present

## 2023-01-16 ENCOUNTER — Other Ambulatory Visit: Payer: Self-pay | Admitting: Family Medicine

## 2023-01-16 DIAGNOSIS — Z1231 Encounter for screening mammogram for malignant neoplasm of breast: Secondary | ICD-10-CM

## 2023-02-03 ENCOUNTER — Ambulatory Visit
Admission: RE | Admit: 2023-02-03 | Discharge: 2023-02-03 | Disposition: A | Discharge status: Home / Self Care | Payer: Medicare Other | Source: Ambulatory Visit | Attending: Family Medicine | Admitting: Family Medicine

## 2023-02-03 DIAGNOSIS — Z1231 Encounter for screening mammogram for malignant neoplasm of breast: Secondary | ICD-10-CM | POA: Diagnosis not present

## 2023-05-14 ENCOUNTER — Ambulatory Visit
Admission: RE | Admit: 2023-05-14 | Discharge: 2023-05-14 | Disposition: A | Discharge status: Home / Self Care | Payer: Medicare Other | Attending: Family Medicine | Admitting: Family Medicine

## 2023-05-14 ENCOUNTER — Other Ambulatory Visit: Payer: Self-pay | Admitting: Family Medicine

## 2023-05-14 ENCOUNTER — Ambulatory Visit: Admission: RE | Admit: 2023-05-14 | Payer: Medicare Other | Source: Ambulatory Visit

## 2023-05-14 DIAGNOSIS — M25511 Pain in right shoulder: Secondary | ICD-10-CM

## 2024-02-09 ENCOUNTER — Other Ambulatory Visit: Payer: Self-pay | Admitting: Family Medicine

## 2024-02-09 DIAGNOSIS — Z1231 Encounter for screening mammogram for malignant neoplasm of breast: Secondary | ICD-10-CM

## 2024-02-18 ENCOUNTER — Inpatient Hospital Stay: Admission: RE | Admit: 2024-02-18 | Source: Ambulatory Visit

## 2024-05-17 ENCOUNTER — Ambulatory Visit
Admission: EM | Admit: 2024-05-17 | Discharge: 2024-05-17 | Disposition: A | Discharge status: Home / Self Care | Attending: Physician Assistant | Admitting: Physician Assistant

## 2024-05-17 DIAGNOSIS — Z23 Encounter for immunization: Secondary | ICD-10-CM

## 2024-05-17 DIAGNOSIS — Z2839 Other underimmunization status: Secondary | ICD-10-CM | POA: Diagnosis not present

## 2024-05-17 DIAGNOSIS — I10 Essential (primary) hypertension: Secondary | ICD-10-CM

## 2024-05-17 DIAGNOSIS — T07XXXA Unspecified multiple injuries, initial encounter: Secondary | ICD-10-CM | POA: Diagnosis not present

## 2024-05-17 MED ORDER — TETANUS-DIPHTH-ACELL PERTUSSIS 5-2.5-18.5 LF-MCG/0.5 IM SUSY
0.5000 mL | PREFILLED_SYRINGE | Freq: Once | INTRAMUSCULAR | Status: AC
  Administered 2024-05-17: 0.5 mL via INTRAMUSCULAR

## 2024-05-17 NOTE — Discharge Instructions (Addendum)
-  Clean wounds with soap and water and monitor for infection. If swelling, redness, pustular drainage return for re-evaluation -Tetanus updated today -Blood pressure is elevated a lot today at 216/95. Continue to check BP at home and keep log. If consistently >140/90 at home please contact PCP. If you have any associated headaches, dizziness, vision changes, confusion, chest pain, palpitations, shortness of breath, weakness call 911.
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
## 2024-09-07 ENCOUNTER — Ambulatory Visit
Admission: RE | Admit: 2024-09-07 | Discharge: 2024-09-07 | Disposition: A | Discharge status: Home / Self Care | Source: Ambulatory Visit | Attending: Family Medicine | Admitting: Family Medicine

## 2024-09-07 DIAGNOSIS — Z1231 Encounter for screening mammogram for malignant neoplasm of breast: Secondary | ICD-10-CM | POA: Diagnosis present
